# Patient Record
Sex: Female | Born: 1980 | Race: White | Hispanic: No | Marital: Single | Smoking: Former smoker
Health system: Southern US, Community
[De-identification: ages and names within clinical notes are randomized; demographics above are authoritative.]

## PROBLEM LIST (undated history)

## (undated) DIAGNOSIS — F419 Anxiety disorder, unspecified: Secondary | ICD-10-CM

## (undated) DIAGNOSIS — D649 Anemia, unspecified: Secondary | ICD-10-CM

## (undated) DIAGNOSIS — F329 Major depressive disorder, single episode, unspecified: Secondary | ICD-10-CM

## (undated) DIAGNOSIS — F32A Depression, unspecified: Secondary | ICD-10-CM

## (undated) HISTORY — DX: Anxiety disorder, unspecified: F41.9

## (undated) HISTORY — DX: Anemia, unspecified: D64.9

## (undated) HISTORY — DX: Major depressive disorder, single episode, unspecified: F32.9

## (undated) HISTORY — DX: Depression, unspecified: F32.A

---

## 2006-02-27 ENCOUNTER — Ambulatory Visit: Payer: Self-pay | Admitting: Family Medicine

## 2007-10-28 ENCOUNTER — Emergency Department: Payer: Self-pay | Admitting: Emergency Medicine

## 2007-12-27 ENCOUNTER — Emergency Department: Payer: Self-pay | Admitting: Internal Medicine

## 2011-03-21 ENCOUNTER — Ambulatory Visit: Payer: Self-pay | Admitting: Advanced Practice Midwife

## 2011-08-08 ENCOUNTER — Inpatient Hospital Stay: Payer: Self-pay

## 2011-10-24 IMAGING — US US OB US >=[ID] SNGL FETUS
1 series · 14 of 28 positions shown · non-contrast
Comparison: none

REASON FOR EXAM: for dating
COMMENTS:

[Series 1: us ob us >=(id) sngl fetus · 0.35mm/px · 14 of 47 slices shown]
[im 2/47]
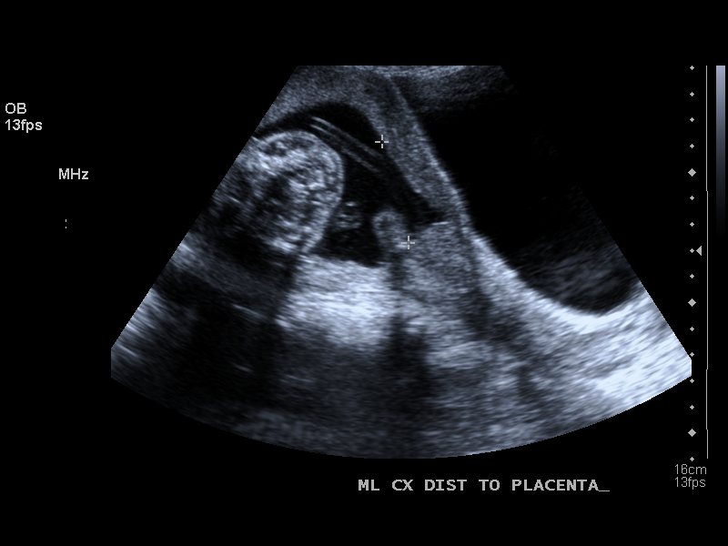
[im 6/47]
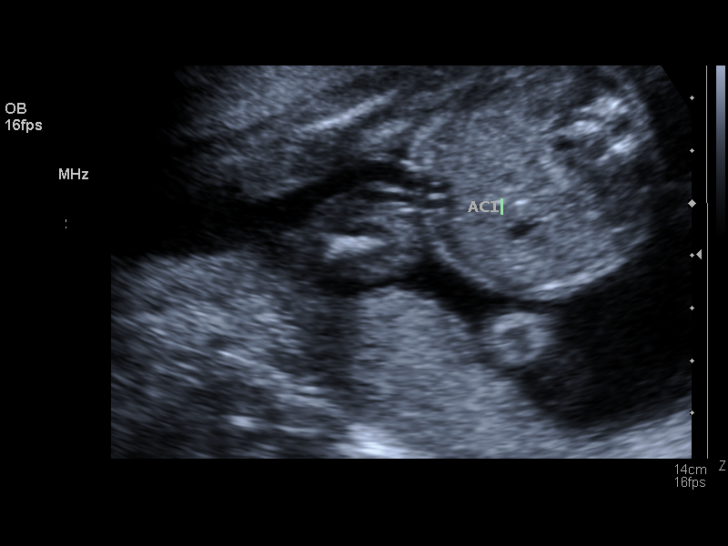
[im 9/47]
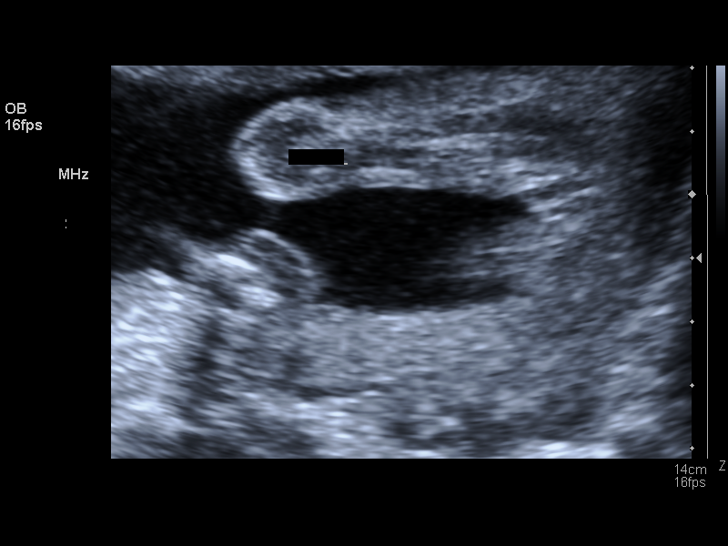
[im 12/47]
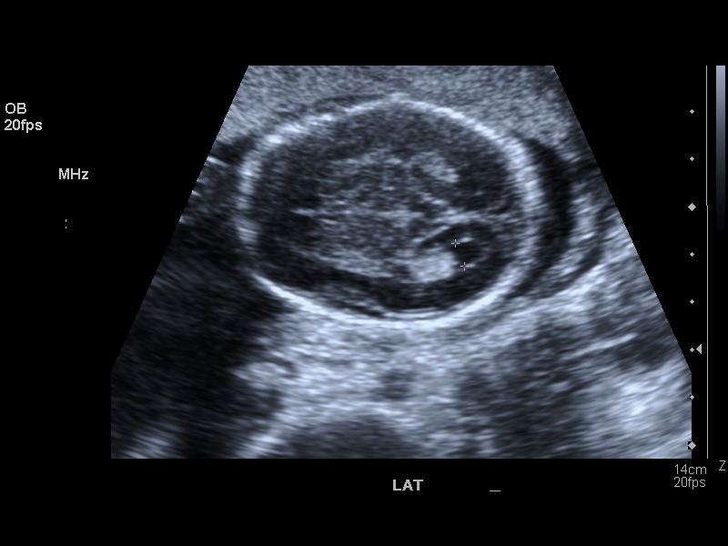
[im 16/47]
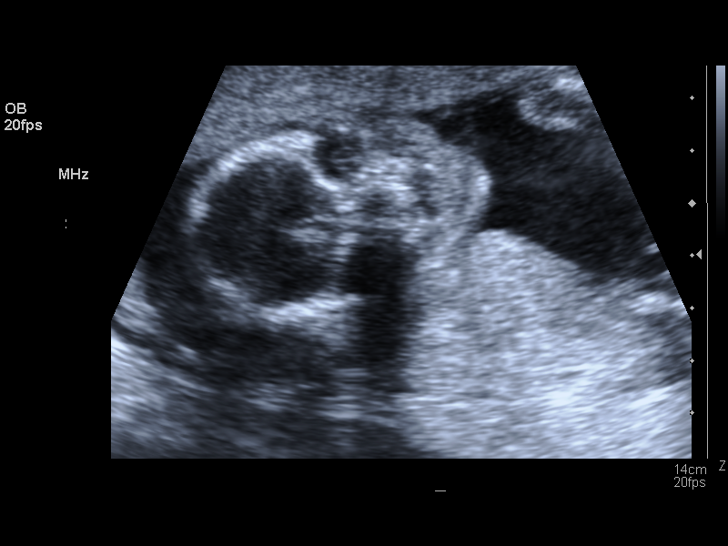
[im 19/47]
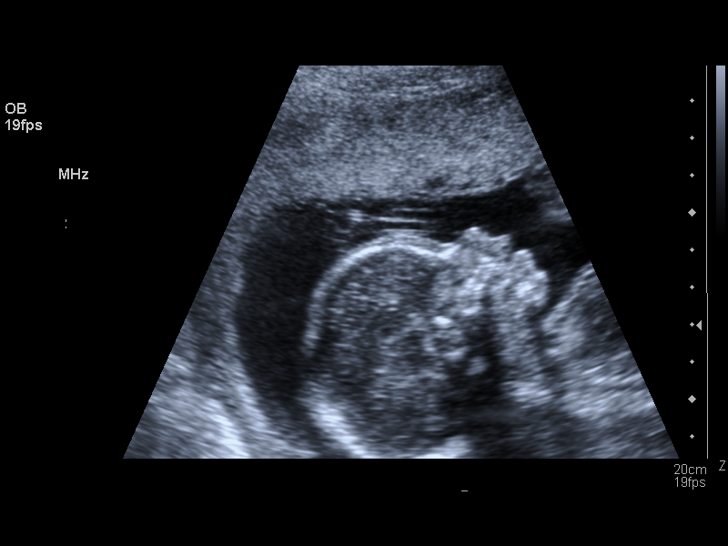
[im 23/47]
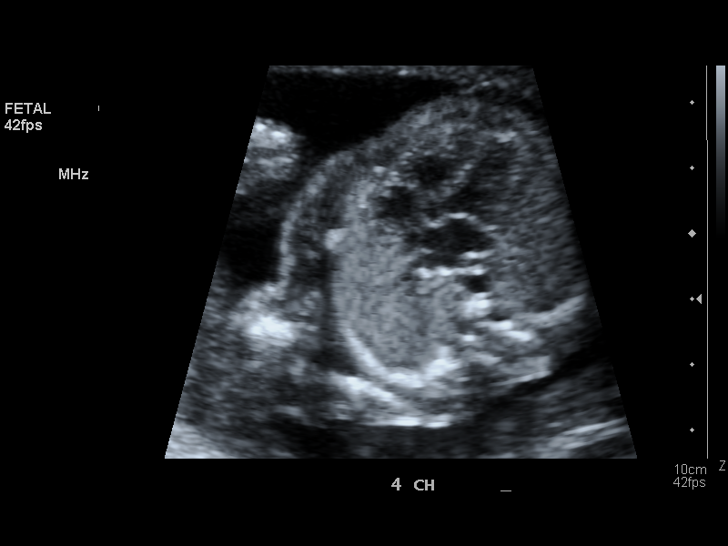
[im 26/47]
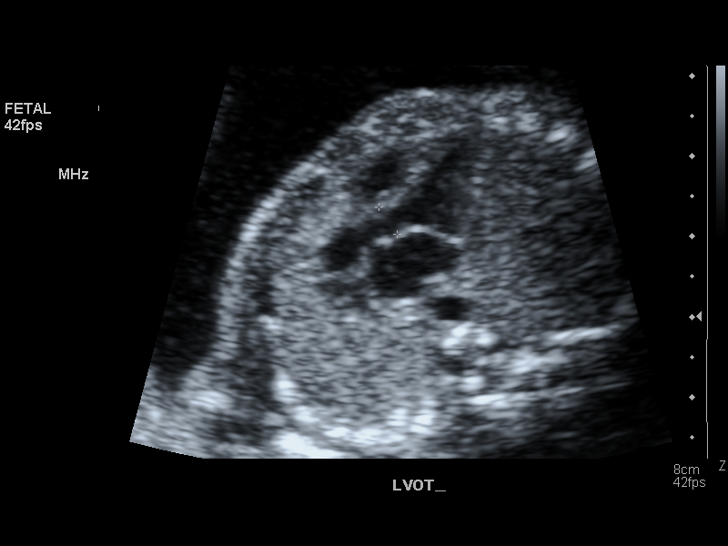
[im 29/47]
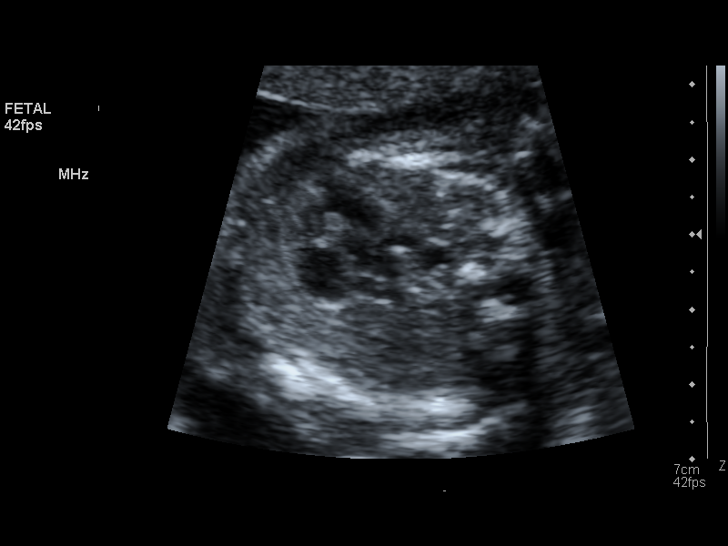
[im 33/47]
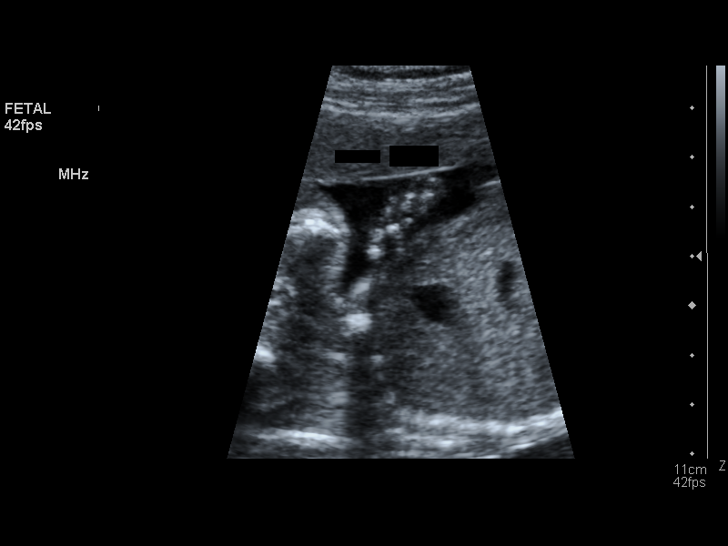
[im 36/47]
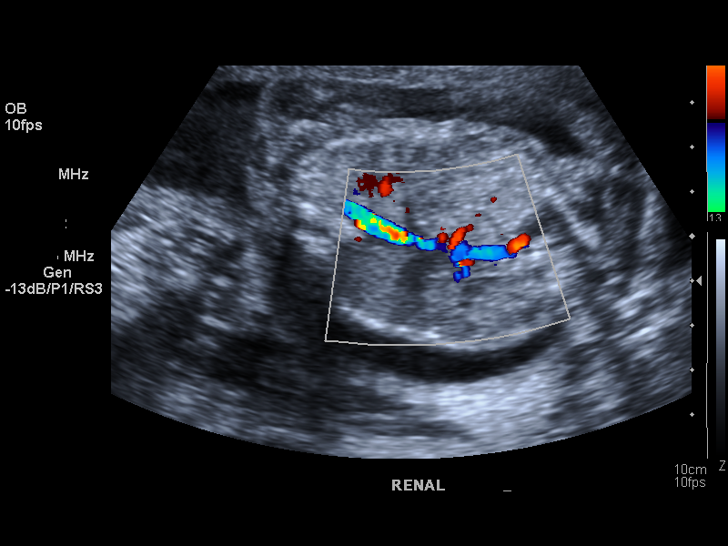
[im 40/47]
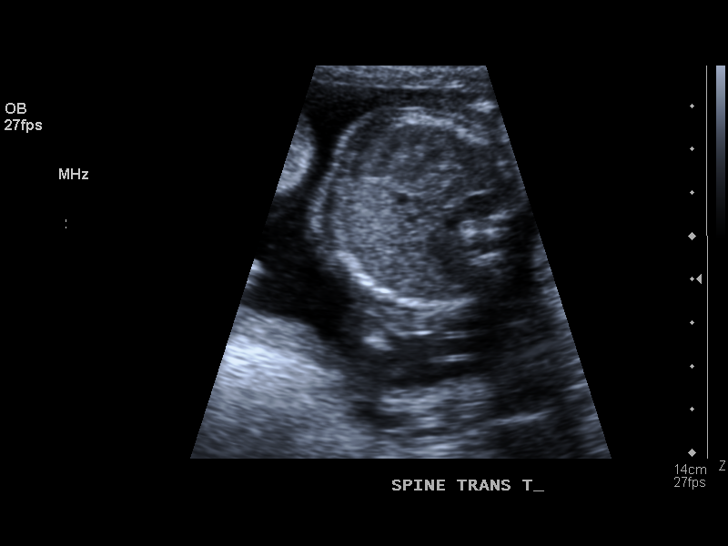
[im 43/47]
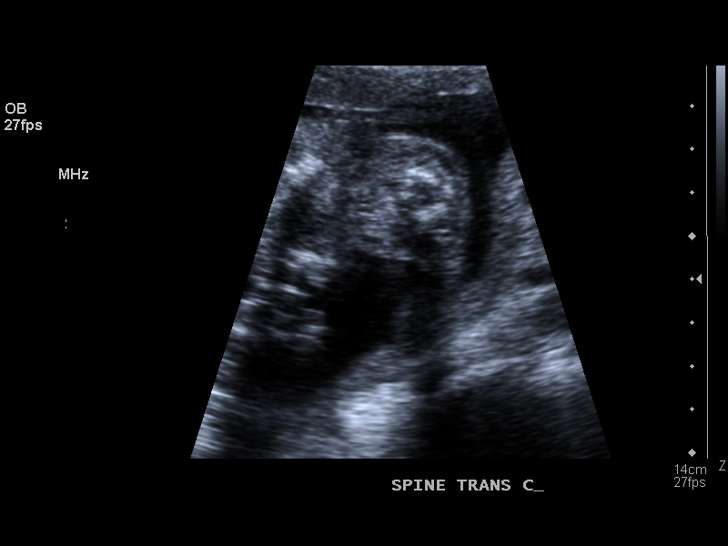
[im 47/47]
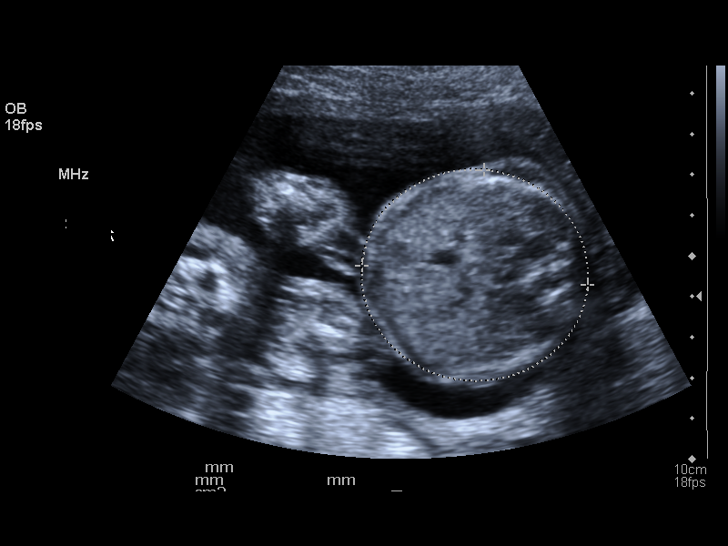

[14 of 28 positions shown; findings below may reference images not displayed]

PROCEDURE:     US  - US OB GREATER/OR EQUAL TO [id number]  - March 21, 2011  [DATE]

RESULT:     There is observed a single living intrauterine gestation.
Presentation currently is breech. Fetal cardiac activity was monitored at
153 beats per minute. Amniotic fluid volume  appears normal. The placenta is
anterior in location. Inferior to the placenta is approximately 4.0 cm above
the cervix. The fetal heart, stomach, and urinary bladder are visualized. No
hydrocephalus or hydronephrosis is seen. No fetal abnormalities are
detected. Fetal measurements are as follows:

BPD     45.9 mm      Corresponding to 19 weeks 6 days.
HC           179.4 mm      Corresponding to 20 weeks 3 days.
AC          160.1 mm      Corresponding to 21 weeks 6 weeks.
FL           35.1 mm      Corresponding to 21 weeks 1 day.
HL           32.6 mm      Corresponding to 21 week 0 days.

EFW is 417 grams + / - 62 grams. Average ultrasound age is 20 weeks 6 days.
Ultrasound EDD is 08/02/2011.
IMPRESSION: Please see above.

## 2012-03-31 ENCOUNTER — Ambulatory Visit: Payer: Self-pay | Admitting: Advanced Practice Midwife

## 2012-07-01 ENCOUNTER — Inpatient Hospital Stay: Payer: Self-pay | Admitting: Obstetrics and Gynecology

## 2012-07-01 LAB — CBC WITH DIFFERENTIAL/PLATELET
Basophil %: 0.3 %
Eosinophil #: 0.1 10*3/uL (ref 0.0–0.7)
HGB: 11.6 g/dL — ABNORMAL LOW (ref 12.0–16.0)
MCH: 34.2 pg — ABNORMAL HIGH (ref 26.0–34.0)
MCHC: 34.6 g/dL (ref 32.0–36.0)
Neutrophil %: 71.6 %
Platelet: 191 10*3/uL (ref 150–440)

## 2012-07-02 LAB — HEMATOCRIT: HCT: 35.4 % (ref 35.0–47.0)

## 2012-11-03 IMAGING — US US OB US >=[ID] SNGL FETUS
1 series · 17 of 28 positions shown · non-contrast
Comparison: none

REASON FOR EXAM: dating
COMMENTS:

[Series 1: us ob us >=(id) sngl fetus · 17 of 56 slices shown]
[im 1/56]
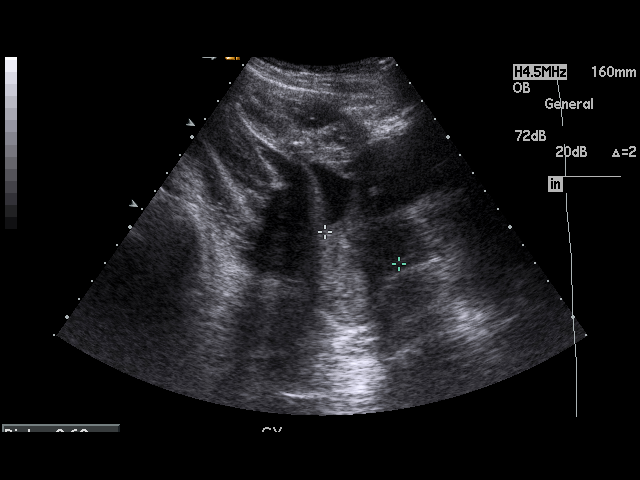
[im 5/56]
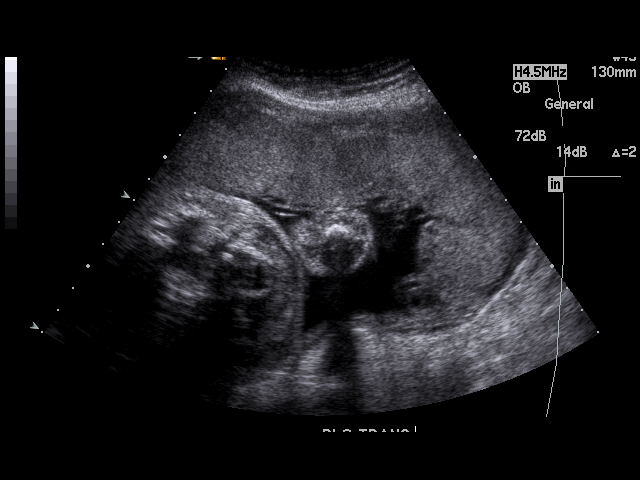
[im 9/56]
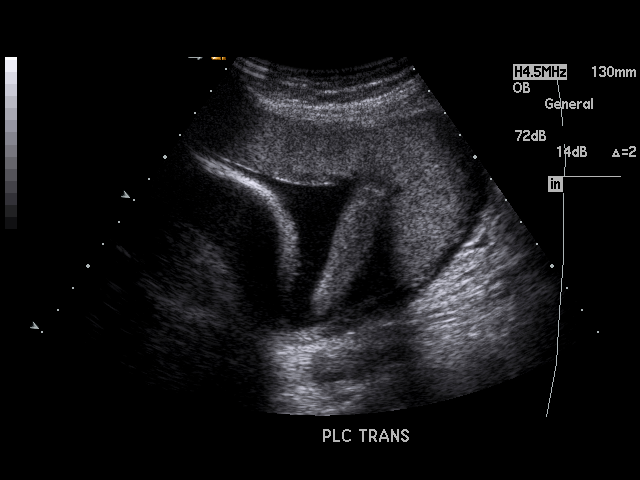
[im 11/56]
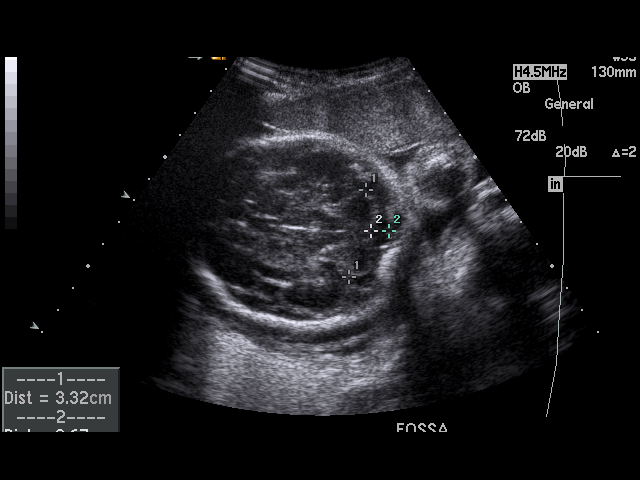
[im 15/56]
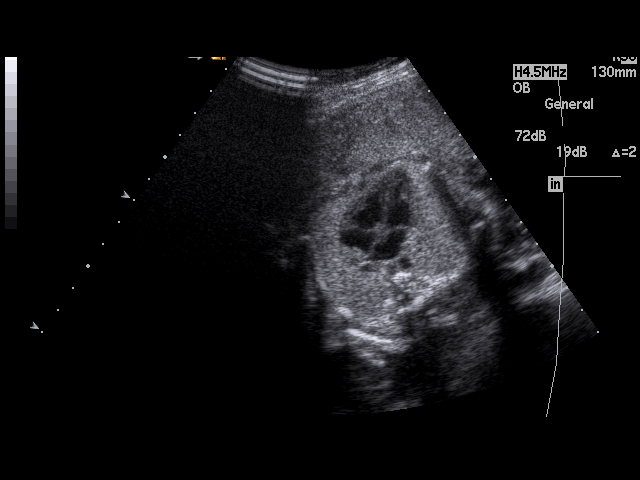
[im 19/56]
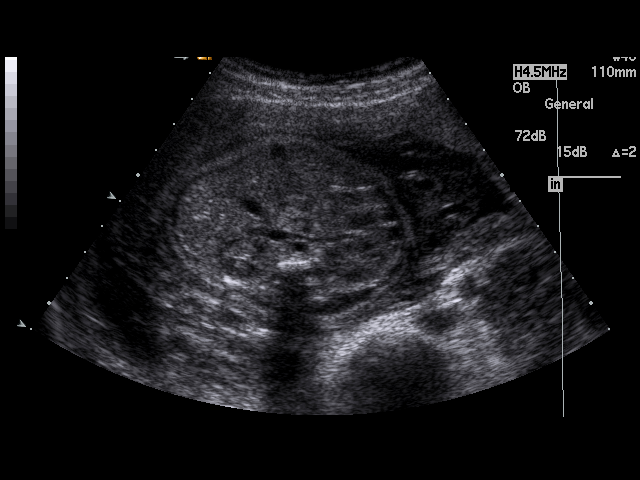
[im 21/56]
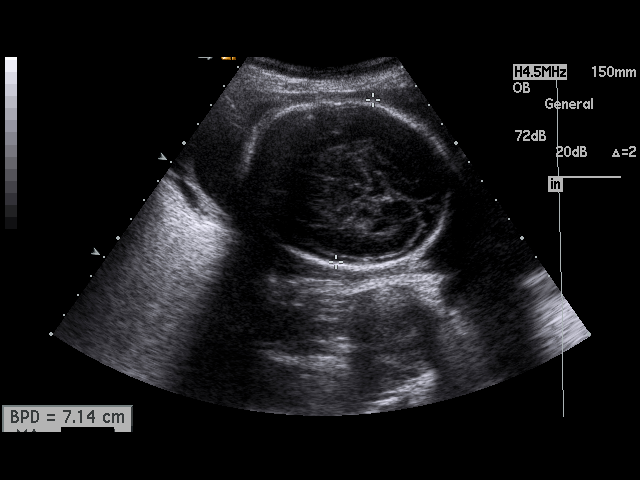
[im 25/56]
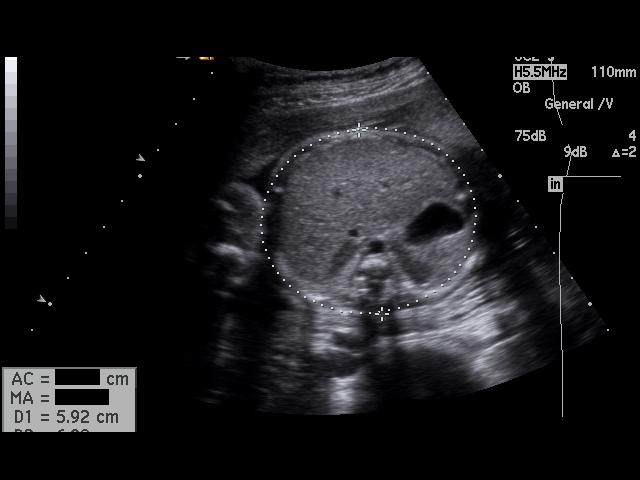
[im 29/56]
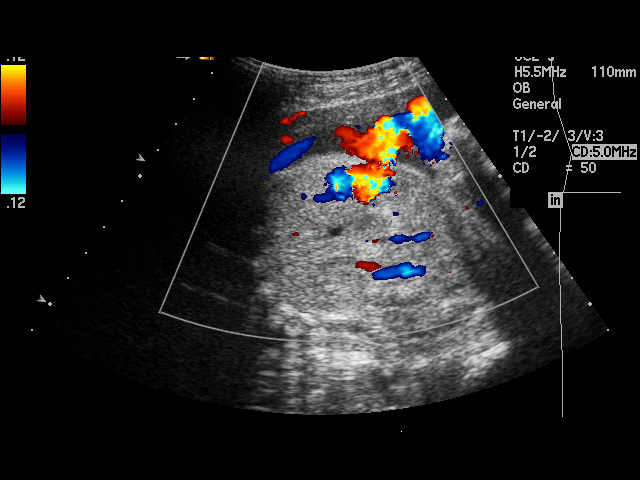
[im 31/56]
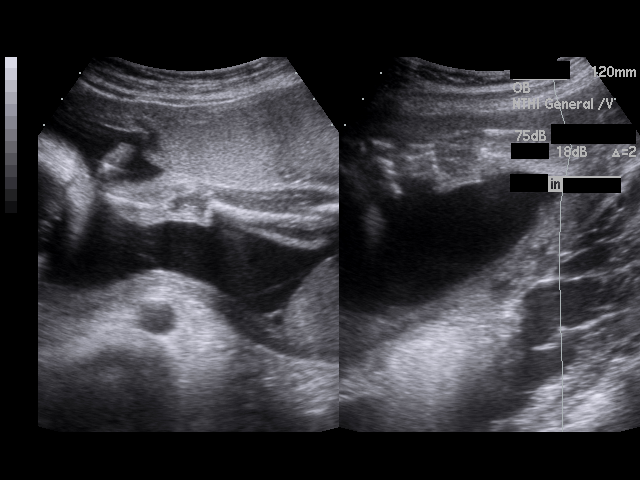
[im 35/56]
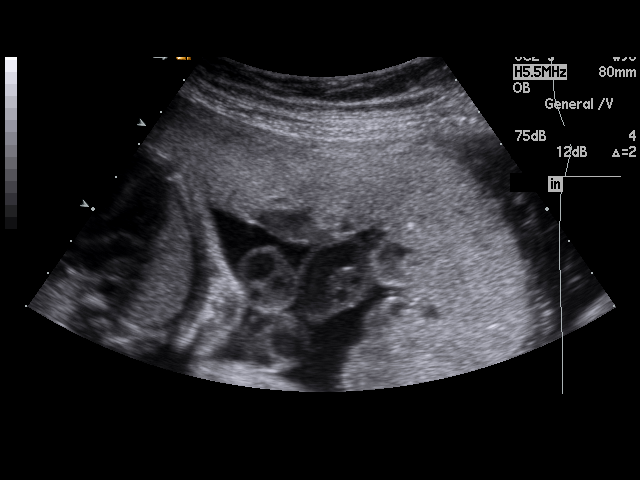
[im 37/56]
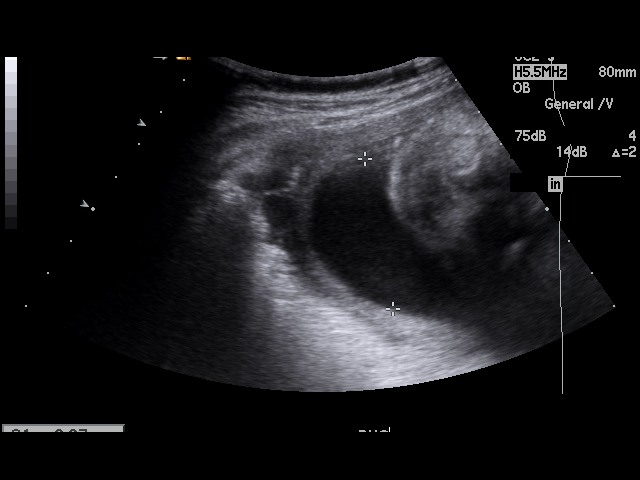
[im 41/56]
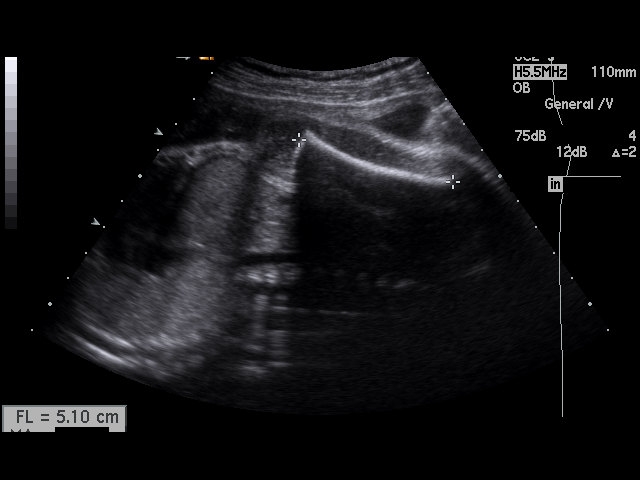
[im 45/56]
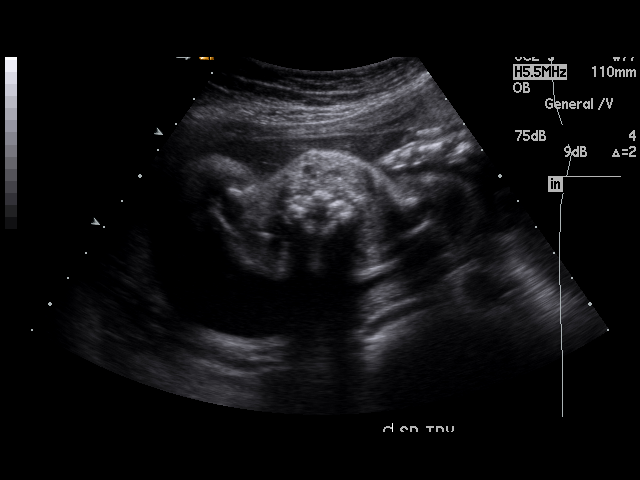
[im 47/56]
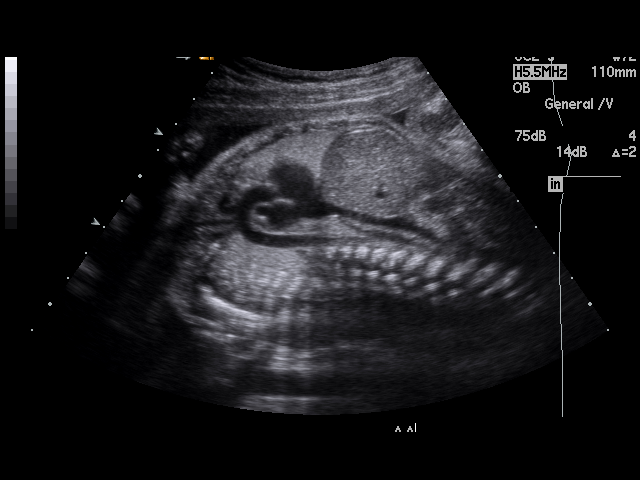
[im 51/56]
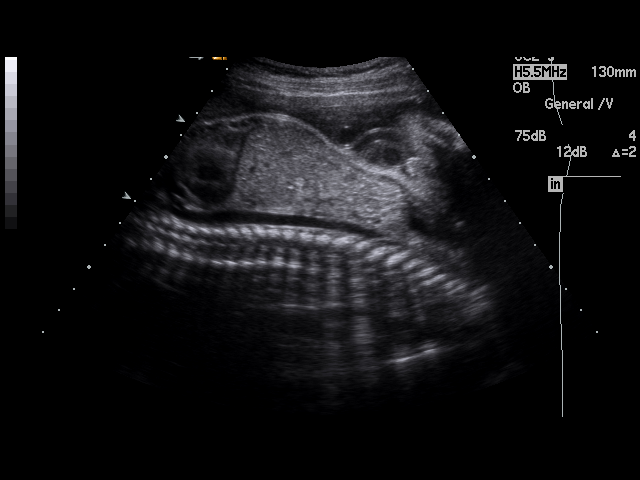
[im 56/56]
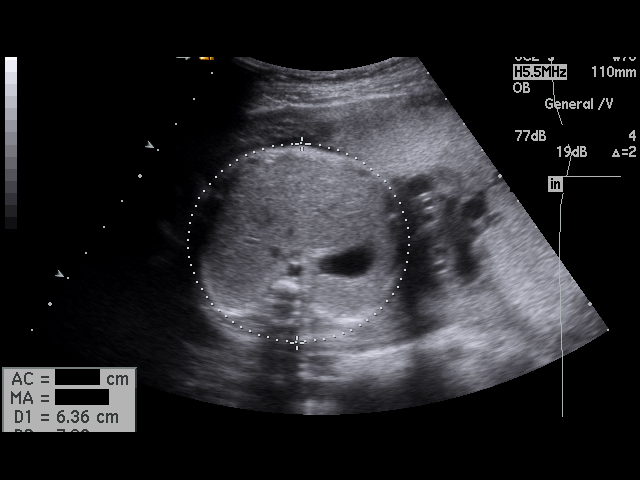

[17 of 28 positions shown; findings below may reference images not displayed]

PROCEDURE:     US  - US OB GREATER/OR EQUAL TO EQQCC  - March 31, 2012 [DATE]

RESULT:     Transabdominal OB protocol pelvic sonogram demonstrates a single
intrauterine fetus with cardiac, trunk, extremity and diaphragmatic motion
visualized during the study. The placenta is anterior in position extending
to the left lateral aspect of the uterus as well. The length of the cervix
is 3.68 cm. The cervix is closed. The distance from the inferior margin of
the placenta to the internal cervical os is 9.98 cm. The amniotic fluid
volume is visually normal with an AFI of 14.9 cm. Fetal anatomy is normal.
Based on the fetal measurements obtained, gestational age is estimated at 27
weeks 1 day + / - a standard deviation of 14 to 15 days with an ultrasound EDD
June 29, 2012. The current estimated fetal weight is 933 grams + / - 125
grams.
IMPRESSION: 1.     Viable single intrauterine fetus of 27 weeks 1 day as discussed
above.
2.     Fetal heart rate measures 131 beats per minute.

## 2013-05-10 ENCOUNTER — Ambulatory Visit: Payer: Self-pay | Admitting: Internal Medicine

## 2014-10-17 ENCOUNTER — Ambulatory Visit: Payer: Self-pay | Admitting: General Surgery

## 2014-10-19 ENCOUNTER — Ambulatory Visit: Payer: Self-pay | Admitting: Nurse Practitioner

## 2014-11-02 ENCOUNTER — Encounter: Payer: Self-pay | Admitting: *Deleted

## 2015-05-09 NOTE — H&P (Signed)
L&D Evaluation:  History:   HPI 34yo [id number] at term presents with regular uterine contrtactions.  notes good fetal movement, no leakage of fluid, no vaginal bleeding.  pregnancy uncomplicated.    Patient's Medical History No Chronic Illness    Patient's Surgical History none    Medications Pre Natal Vitamins  Iron  zyrtec, "something for heartburn"    Allergies NKDA    Social History none  gave one of babies up for adoption. Current father of baby does not know    Family History Non-Contributory   ROS:   ROS All systems were reviewed.  HEENT, CNS, GI, GU, Respiratory, CV, Renal and Musculoskeletal systems were found to be normal., unless noted in HPI   Exam:   Vital Signs stable    General mild distress with contractions    Mental Status clear    Chest clear    Heart normal sinus rhythm    Abdomen gravid, tender with contractions    Estimated Fetal Weight Average for gestational age    Fetal Position cephalic    Back no CVAT    Edema no edema    Pelvic 6cm per RN    Mebranes Intact    FHT normal rate with no decels    FHT Description 135/mod var/+accels/no decels    Ucx irregular, 3-4 q 10 min   Impression:   Impression active labor   Plan:   Plan EFM/NST, monitor contractions and for cervical change    Comments admit for labor GBS neg expectant management desires epidural, will get admission labs   Electronic Signatures: Conard NovakJackson, Ellese Julius D (MD)  (Signed 03-Jul-13 09:42)  Authored: L&D Evaluation   Last Updated: 03-Jul-13 09:42 by Conard NovakJackson, Wyley Hack D (MD)

## 2018-10-01 ENCOUNTER — Encounter: Payer: Self-pay | Admitting: Oncology

## 2018-10-01 ENCOUNTER — Inpatient Hospital Stay: Payer: Medicaid Other

## 2018-10-01 ENCOUNTER — Other Ambulatory Visit: Payer: Self-pay

## 2018-10-01 ENCOUNTER — Encounter (INDEPENDENT_AMBULATORY_CARE_PROVIDER_SITE_OTHER): Payer: Self-pay

## 2018-10-01 ENCOUNTER — Inpatient Hospital Stay: Payer: Medicaid Other | Attending: Oncology | Admitting: Oncology

## 2018-10-01 VITALS — BP 126/86 | HR 76 | Temp 97.3°F | Resp 18 | Ht 63.0 in | Wt 174.2 lb

## 2018-10-01 DIAGNOSIS — D649 Anemia, unspecified: Secondary | ICD-10-CM | POA: Diagnosis present

## 2018-10-01 DIAGNOSIS — R5383 Other fatigue: Secondary | ICD-10-CM

## 2018-10-01 DIAGNOSIS — F419 Anxiety disorder, unspecified: Secondary | ICD-10-CM | POA: Diagnosis not present

## 2018-10-01 DIAGNOSIS — D509 Iron deficiency anemia, unspecified: Secondary | ICD-10-CM | POA: Diagnosis not present

## 2018-10-01 DIAGNOSIS — F418 Other specified anxiety disorders: Secondary | ICD-10-CM

## 2018-10-01 DIAGNOSIS — Z87891 Personal history of nicotine dependence: Secondary | ICD-10-CM

## 2018-10-01 DIAGNOSIS — Z803 Family history of malignant neoplasm of breast: Secondary | ICD-10-CM

## 2018-10-01 LAB — COMPREHENSIVE METABOLIC PANEL
ALT: 19 U/L (ref 0–44)
ANION GAP: 9 (ref 5–15)
AST: 28 U/L (ref 15–41)
Albumin: 4.4 g/dL (ref 3.5–5.0)
Alkaline Phosphatase: 48 U/L (ref 38–126)
BILIRUBIN TOTAL: 0.6 mg/dL (ref 0.3–1.2)
BUN: 14 mg/dL (ref 6–20)
CO2: 25 mmol/L (ref 22–32)
Calcium: 9.5 mg/dL (ref 8.9–10.3)
Chloride: 106 mmol/L (ref 98–111)
Creatinine, Ser: 0.9 mg/dL (ref 0.44–1.00)
Glucose, Bld: 99 mg/dL (ref 70–99)
Potassium: 4.3 mmol/L (ref 3.5–5.1)
Sodium: 140 mmol/L (ref 135–145)
TOTAL PROTEIN: 7.9 g/dL (ref 6.5–8.1)

## 2018-10-01 LAB — CBC WITH DIFFERENTIAL/PLATELET
Basophils Absolute: 0 10*3/uL (ref 0–0.1)
Basophils Relative: 0 %
EOS PCT: 4 %
Eosinophils Absolute: 0.3 10*3/uL (ref 0–0.7)
HEMATOCRIT: 36.1 % (ref 35.0–47.0)
Hemoglobin: 12.5 g/dL (ref 12.0–16.0)
LYMPHS PCT: 26 %
Lymphs Abs: 2 10*3/uL (ref 1.0–3.6)
MCH: 32 pg (ref 26.0–34.0)
MCHC: 34.6 g/dL (ref 32.0–36.0)
MCV: 92.5 fL (ref 80.0–100.0)
MONO ABS: 0.3 10*3/uL (ref 0.2–0.9)
MONOS PCT: 3 %
NEUTROS ABS: 5.1 10*3/uL (ref 1.4–6.5)
Neutrophils Relative %: 67 %
PLATELETS: 260 10*3/uL (ref 150–440)
RBC: 3.91 MIL/uL (ref 3.80–5.20)
RDW: 12.7 % (ref 11.5–14.5)
WBC: 7.7 10*3/uL (ref 3.6–11.0)

## 2018-10-01 LAB — URINALYSIS, COMPLETE (UACMP) WITH MICROSCOPIC
BACTERIA UA: NONE SEEN
BILIRUBIN URINE: NEGATIVE
Glucose, UA: NEGATIVE mg/dL
KETONES UR: NEGATIVE mg/dL
LEUKOCYTES UA: NEGATIVE
Nitrite: NEGATIVE
Protein, ur: NEGATIVE mg/dL
SPECIFIC GRAVITY, URINE: 1.019 (ref 1.005–1.030)
pH: 5 (ref 5.0–8.0)

## 2018-10-01 LAB — RETICULOCYTES
RBC.: 3.73 MIL/uL — ABNORMAL LOW (ref 3.80–5.20)
RETIC CT PCT: 1.5 % (ref 0.4–3.1)
Retic Count, Absolute: 56 10*3/uL (ref 19.0–183.0)

## 2018-10-01 LAB — IRON AND TIBC
IRON: 64 ug/dL (ref 28–170)
SATURATION RATIOS: 18 % (ref 10.4–31.8)
TIBC: 362 ug/dL (ref 250–450)
UIBC: 298 ug/dL

## 2018-10-01 LAB — FERRITIN: FERRITIN: 94 ng/mL (ref 11–307)

## 2018-10-01 LAB — VITAMIN B12: Vitamin B-12: 328 pg/mL (ref 180–914)

## 2018-10-01 LAB — LACTATE DEHYDROGENASE: LDH: 133 U/L (ref 98–192)

## 2018-10-01 LAB — FOLATE: FOLATE: 24 ng/mL (ref >5.9)

## 2018-10-02 ENCOUNTER — Encounter: Payer: Self-pay | Admitting: Oncology

## 2018-10-02 LAB — CELIAC DISEASE PANEL
ENDOMYSIAL ANTIBODY IGA: NEGATIVE
IgA: 228 mg/dL (ref 87–352)

## 2018-10-02 LAB — MULTIPLE MYELOMA PANEL, SERUM
ALPHA2 GLOB SERPL ELPH-MCNC: 0.8 g/dL (ref 0.4–1.0)
Albumin SerPl Elph-Mcnc: 3.9 g/dL (ref 2.9–4.4)
Albumin/Glob SerPl: 1.3 (ref 0.7–1.7)
Alpha 1: 0.2 g/dL (ref 0.0–0.4)
B-Globulin SerPl Elph-Mcnc: 1.1 g/dL (ref 0.7–1.3)
Gamma Glob SerPl Elph-Mcnc: 1 g/dL (ref 0.4–1.8)
Globulin, Total: 3.2 g/dL (ref 2.2–3.9)
IGG (IMMUNOGLOBIN G), SERUM: 978 mg/dL (ref 700–1600)
IGM (IMMUNOGLOBULIN M), SRM: 144 mg/dL (ref 26–217)
IgA: 231 mg/dL (ref 87–352)
TOTAL PROTEIN ELP: 7.1 g/dL (ref 6.0–8.5)

## 2018-10-02 LAB — HAPTOGLOBIN: Haptoglobin: 146 mg/dL (ref 34–200)

## 2018-10-02 NOTE — Progress Notes (Signed)
Hematology/Oncology Consult note [facility] [phone] [phone]  Patient Care Team: Fayrene Helper, NP as PCP - General (Nurse Practitioner)   Name of the patient: Toni Wilkins  [id number]  [birth year]    Reason for referral- anemia   Referring physician- Dr. Karma Greaser  Date of visit: 10/02/18   History of presenting illness-patient is a 37 year old female with a past medical history significant for anxiety and depression.  She is also on Suboxone for opioid addiction.  She has been referred to [place] for normocytic anemia.Recent CBC from 09/01/2018 showed white count of 6.8, H&H of 9.3/28.8 with an MCV of 96 and a platelet count of 269.  TSH was within normal limits CMP was within normal limits.  I do not have any prior counts for comparison.  I also do not have any iron studies for my review.  Patient however states that she has had iron studies checked in the recent past and was told that she is iron deficient and started taking oral iron tablets for the last 2 to 3 weeks.  She admits that she has not been taking them every day as she has some abdominal pain associated with it.  Denies any family history of colon cancer.  Denies any blood in her stool or urine.  Denies any consistent use of NSAIDs.  Patient mainly reports fatigue but denies other complaints.  Her appetite is good and she denies any unintentional weight loss  ECOG PS- 0  Pain scale- 0   Review of systems- Review of Systems  Constitutional: Negative for chills, fever, malaise/fatigue and weight loss.  HENT: Negative for congestion, ear discharge and nosebleeds.   Eyes: Negative for blurred vision.  Respiratory: Negative for cough, hemoptysis, sputum production, shortness of breath and wheezing.   Cardiovascular: Negative for chest pain, palpitations, orthopnea and claudication.  Gastrointestinal: Negative for abdominal pain, blood in stool, constipation, diarrhea, heartburn,  melena, nausea and vomiting.  Genitourinary: Negative for dysuria, flank pain, frequency, hematuria and urgency.  Musculoskeletal: Negative for back pain, joint pain and myalgias.  Skin: Negative for rash.  Neurological: Negative for dizziness, tingling, focal weakness, seizures, weakness and headaches.  Endo/Heme/Allergies: Does not bruise/bleed easily.  Psychiatric/Behavioral: Negative for depression and suicidal ideas. The patient does not have insomnia.     Not on File  There are no active problems to display for this patient.    Past Medical History:  Diagnosis Date  . Anemia   . Anxiety   . Depression      History reviewed. No pertinent surgical history.  Social History   Socioeconomic History  . Marital status: Single    Spouse name: Not on file  . Number of children: Not on file  . Years of education: Not on file  . Highest education level: Not on file  Occupational History  . Not on file  Social Needs  . Financial resource strain: Not on file  . Food insecurity:    Worry: Not on file    Inability: Not on file  . Transportation needs:    Medical: Not on file    Non-medical: Not on file  Tobacco Use  . Smoking status: Former Smoker    Types: Cigarettes    Last attempt to quit: 10/01/1998    Years since quitting: 20.0  . Smokeless tobacco: Never Used  Substance and Sexual Activity  . Alcohol use: Not on file  . Drug use: Not on file  . Sexual activity: Not  on file  Lifestyle  . Physical activity:    Days per week: Not on file    Minutes per session: Not on file  . Stress: Not on file  Relationships  . Social connections:    Talks on phone: Not on file    Gets together: Not on file    Attends religious service: Not on file    Active member of club or organization: Not on file    Attends meetings of clubs or organizations: Not on file    Relationship status: Not on file  . Intimate partner violence:    Fear of current or ex partner: Not on file     Emotionally abused: Not on file    Physically abused: Not on file    Forced sexual activity: Not on file  Other Topics Concern  . Not on file  Social History Narrative  . Not on file     Family History  Problem Relation Age of Onset  . Breast cancer Mother      Current Outpatient Medications:  .  buPROPion (WELLBUTRIN XL) 300 MG 24 hr tablet, , Disp: , Rfl: 2 .  clonazePAM (KLONOPIN) 0.5 MG tablet, , Disp: , Rfl: 0 .  clonazePAM (KLONOPIN) 1 MG tablet, , Disp: , Rfl: 0 .  ferrous sulfate 325 (65 FE) MG tablet, Take 325 mg by mouth 2 (two) times daily with a meal., Disp: , Rfl:  .  sertraline (ZOLOFT) 100 MG tablet, , Disp: , Rfl: 2 .  SPRINTEC 28 0.25-35 MG-MCG tablet, , Disp: , Rfl: 11 .  SUBOXONE 8-2 MG FILM, , Disp: , Rfl: 0   Physical exam:  Vitals:   10/01/18 1009  BP: 126/86  Pulse: 76  Resp: 18  Temp: (!) 97.3 F (36.3 C)  TempSrc: Tympanic  SpO2: 95%  Weight: 174 lb 3.2 oz (79 kg)  Height: 5\' 3"  (1.6 m)   Physical Exam  Constitutional: She is oriented to person, place, and time. She appears well-developed and well-nourished.  HENT:  Head: Normocephalic and atraumatic.  Eyes: Pupils are equal, round, and reactive to light. EOM are normal.  Neck: Normal range of motion.  Cardiovascular: Normal rate, regular rhythm and normal heart sounds.  Pulmonary/Chest: Effort normal and breath sounds normal.  Abdominal: Soft. Bowel sounds are normal.  Lymphadenopathy:  No palpable cervical, supraclavicular, axillary or inguinal adenopathy   Neurological: She is alert and oriented to person, place, and time.  Skin: Skin is warm and dry.       CMP Latest Ref Rng & Units 10/01/2018  Glucose 70 - 99 mg/dL 99  BUN 6 - 20 mg/dL 14  Creatinine 1.61 - 0.96 mg/dL 0.45  Sodium 409 - 811 mmol/L 140  Potassium 3.5 - 5.1 mmol/L 4.3  Chloride 98 - 111 mmol/L 106  CO2 22 - 32 mmol/L 25  Calcium 8.9 - 10.3 mg/dL 9.5  Total Protein 6.5 - 8.1 g/dL 7.9  Total Bilirubin 0.3 -  1.2 mg/dL 0.6  Alkaline Phos 38 - 126 U/L 48  AST 15 - 41 U/L 28  ALT 0 - 44 U/L 19   CBC Latest Ref Rng & Units 10/01/2018  WBC 3.6 - 11.0 K/uL 7.7  Hemoglobin 12.0 - 16.0 g/dL 91.4  Hematocrit 78.2 - 47.0 % 36.1  Platelets 150 - 440 K/uL 260     Assessment and plan- Patient is a 37 y.o. female referred for normocytic anemia presumably iron deficiency per patient history  I do  not have any recent iron studies for review.  Patient however reports that her iron studies have been checked and she was found to be iron deficient and started on oral iron.  Today I will check a CBC with differential, ferritin and iron studies, B12 and folate, CMP, myeloma panel, serum free light chains, reticulocyte count and haptoglobin as well as LDH.  I will see her back in 2 weeks time to discuss the results of her blood work.  I will also work her up for possible etiology of her iron deficiency anemia including urinalysis and celiac disease panel.  Patient states that she is a stool occult cards which were negative but negative stool occult cards do not rule out colon cancer or bleeding and they had been designed for colon cancer screening and should not be used in the work-up of iron deficiency anemia.   Thank you for this kind referral and the opportunity to participate in the care of this patient   Visit Diagnosis 1. Normocytic anemia     Dr. Owens Shark, MD, MPH [facility] at [facility] [phone] 10/02/2018 10:55 AM

## 2018-10-13 ENCOUNTER — Inpatient Hospital Stay (HOSPITAL_BASED_OUTPATIENT_CLINIC_OR_DEPARTMENT_OTHER): Payer: Medicaid Other | Admitting: Oncology

## 2018-10-13 ENCOUNTER — Encounter: Payer: Self-pay | Admitting: Oncology

## 2018-10-13 VITALS — BP 119/76 | HR 97 | Temp 97.9°F | Resp 18 | Ht 63.0 in | Wt 173.9 lb

## 2018-10-13 DIAGNOSIS — D509 Iron deficiency anemia, unspecified: Secondary | ICD-10-CM | POA: Diagnosis not present

## 2018-10-13 DIAGNOSIS — Z87891 Personal history of nicotine dependence: Secondary | ICD-10-CM

## 2018-10-13 DIAGNOSIS — D649 Anemia, unspecified: Secondary | ICD-10-CM | POA: Diagnosis not present

## 2018-10-13 NOTE — Progress Notes (Signed)
No new changes noted today 

## 2018-10-15 ENCOUNTER — Ambulatory Visit: Payer: Medicaid Other | Admitting: Oncology

## 2018-10-16 NOTE — Progress Notes (Signed)
Hematology/Oncology Consult note [facility]  Telephone:([phone] Fax:([phone]  Patient Care Team: Danelle Berry, NP as PCP - General (Nurse Practitioner)   Name of the patient: Toni Wilkins  [id number]  Dec 03, 1981   Date of visit: 10/16/18  Diagnosis-history of iron deficiency anemia  Chief complaint/ Reason for visit-discuss results of blood work  Heme/Onc history: patient is a 37 year old female with a past medical history significant for anxiety and depression.  She is also on Suboxone for opioid addiction.  She has been referred to [place] for normocytic anemia.Recent CBC from 09/01/2018 showed white count of 6.8, H&H of 9.3/28.8 with an MCV of 96 and a platelet count of 269.  TSH was within normal limits CMP was within normal limits.  I do not have any prior counts for comparison.  I also do not have any iron studies for my review.  Patient however states that she has had iron studies checked in the recent past and was told that she is iron deficient and started taking oral iron tablets for the last 2 to 3 weeks.  She admits that she has not been taking them every day as she has some abdominal pain associated with it.  Denies any family history of colon cancer.  Denies any blood in her stool or urine.  Denies any consistent use of NSAIDs.  Patient mainly reports fatigue but denies other complaints.  Her appetite is good and she denies any unintentional weight loss  Blood work from 10/01/2018 was as follows: CBC showed white, H&H of 12.5/36.1 with a platelet count of 260.  Urinalysis was negative for hematuria.  MP was normal.  B12 and folate levels were normal.  Ferritin was normal at 94 and iron studies were normal.  Multiple myeloma panel showed no M protein.  Haptoglobin was normal.  LDH was normal at 133.  Celiac disease panel was negative.   Interval history-patient is tolerating oral iron well and reports no constipation or abdominal pain.  Denies  any blood in her stool or urine.  Denies any dark melanotic stool  ECOG PS- 0 Pain scale- 0 Opioid associated constipation- no  Review of systems- Review of Systems  Constitutional: Negative for chills, fever, malaise/fatigue and weight loss.  HENT: Negative for congestion, ear discharge and nosebleeds.   Eyes: Negative for blurred vision.  Respiratory: Negative for cough, hemoptysis, sputum production, shortness of breath and wheezing.   Cardiovascular: Negative for chest pain, palpitations, orthopnea and claudication.  Gastrointestinal: Negative for abdominal pain, blood in stool, constipation, diarrhea, heartburn, melena, nausea and vomiting.  Genitourinary: Negative for dysuria, flank pain, frequency, hematuria and urgency.  Musculoskeletal: Negative for back pain, joint pain and myalgias.  Skin: Negative for rash.  Neurological: Negative for dizziness, tingling, focal weakness, seizures, weakness and headaches.  Endo/Heme/Allergies: Does not bruise/bleed easily.  Psychiatric/Behavioral: Negative for depression and suicidal ideas. The patient does not have insomnia.       Not on File   Past Medical History:  Diagnosis Date  . Anemia   . Anxiety   . Depression      History reviewed. No pertinent surgical history.  Social History   Socioeconomic History  . Marital status: Single    Spouse name: Not on file  . Number of children: Not on file  . Years of education: Not on file  . Highest education level: Not on file  Occupational History  . Not on file  Social Needs  . Financial  resource strain: Not on file  . Food insecurity:    Worry: Not on file    Inability: Not on file  . Transportation needs:    Medical: Not on file    Non-medical: Not on file  Tobacco Use  . Smoking status: Former Smoker    Types: Cigarettes    Last attempt to quit: 10/01/1998    Years since quitting: 20.0  . Smokeless tobacco: Never Used  Substance and Sexual Activity  . Alcohol use:  Not on file  . Drug use: Not on file  . Sexual activity: Not on file  Lifestyle  . Physical activity:    Days per week: Not on file    Minutes per session: Not on file  . Stress: Not on file  Relationships  . Social connections:    Talks on phone: Not on file    Gets together: Not on file    Attends religious service: Not on file    Active member of club or organization: Not on file    Attends meetings of clubs or organizations: Not on file    Relationship status: Not on file  . Intimate partner violence:    Fear of current or ex partner: Not on file    Emotionally abused: Not on file    Physically abused: Not on file    Forced sexual activity: Not on file  Other Topics Concern  . Not on file  Social History Narrative  . Not on file    Family History  Problem Relation Age of Onset  . Breast cancer Mother      Current Outpatient Medications:  .  buPROPion (WELLBUTRIN XL) 300 MG 24 hr tablet, , Disp: , Rfl: 2 .  clonazePAM (KLONOPIN) 0.5 MG tablet, , Disp: , Rfl: 0 .  clonazePAM (KLONOPIN) 1 MG tablet, , Disp: , Rfl: 0 .  ferrous sulfate 325 (65 FE) MG tablet, Take 325 mg by mouth 2 (two) times daily with a meal., Disp: , Rfl:  .  sertraline (ZOLOFT) 100 MG tablet, , Disp: , Rfl: 2 .  SPRINTEC 28 0.25-35 MG-MCG tablet, , Disp: , Rfl: 11 .  SUBOXONE 8-2 MG FILM, , Disp: , Rfl: 0  Physical exam:  Vitals:   10/13/18 1128  BP: 119/76  Pulse: 97  Resp: 18  Temp: 97.9 F (36.6 C)  TempSrc: Tympanic  SpO2: 97%  Weight: 173 lb 14.4 oz (78.9 kg)  Height: _0  (1.6 m)   Physical Exam  Constitutional: She is oriented to person, place, and time. She appears well-developed and well-nourished.  HENT:  Head: Normocephalic and atraumatic.  Eyes: Pupils are equal, round, and reactive to light. EOM are normal.  Neck: Normal range of motion.  Cardiovascular: Normal rate, regular rhythm and normal heart sounds.  Pulmonary/Chest: Effort normal and breath sounds normal.    Abdominal: Soft. Bowel sounds are normal.  Neurological: She is alert and oriented to person, place, and time.  Skin: Skin is warm and dry.     CMP Latest Ref Rng & Units 10/01/2018  Glucose 70 - 99 mg/dL 99  BUN 6 - 20 mg/dL 14  Creatinine 0.44 - 1.00 mg/dL 0.90  Sodium 135 - 145 mmol/L 140  Potassium 3.5 - 5.1 mmol/L 4.3  Chloride 98 - 111 mmol/L 106  CO2 22 - 32 mmol/L 25  Calcium 8.9 - 10.3 mg/dL 9.5  Total Protein 6.5 - 8.1 g/dL 7.9  Total Bilirubin 0.3 - 1.2 mg/dL 0.6  Alkaline Phos 38 - 126 U/L 48  AST 15 - 41 U/L 28  ALT 0 - 44 U/L 19   CBC Latest Ref Rng & Units 10/01/2018  WBC 3.6 - 11.0 K/uL 7.7  Hemoglobin 12.0 - 16.0 g/dL 12.5  Hematocrit 35.0 - 47.0 % 36.1  Platelets 150 - 440 K/uL 260     Assessment and plan- Patient is a 37 y.o. female referred for anemia probably do to iron deficiency  Patient is no longer anemic after she started taking oral iron which she is tolerating well.  I reviewed the results of her recent anemia work-up which were unremarkable and her iron studies are normal white IV iron.  Discussed that the stability of the etiology of her iron deficiency anemia and she reports that her menstrual cycles are not particularly heavy.  Discussed the need for GI work-up.  Patient would like to defer it at this time but consider if she becomes iron deficient again in the future  Repeat CBC with ferritin and iron studies in 3 in 6 months and I will see her back in 6 months   Visit Diagnosis 1. Normocytic anemia   2. Iron deficiency anemia, unspecified iron deficiency anemia type      Dr. Randa Evens, MD, MPH [facility] at [facility] [phone] 10/16/2018 8:33 AM

## 2019-01-14 ENCOUNTER — Inpatient Hospital Stay: Payer: Medicaid Other | Attending: Oncology

## 2019-04-15 ENCOUNTER — Telehealth: Payer: Self-pay | Admitting: Oncology

## 2019-04-16 ENCOUNTER — Inpatient Hospital Stay: Payer: Medicaid Other | Attending: Oncology | Admitting: Oncology

## 2019-04-16 ENCOUNTER — Other Ambulatory Visit: Payer: Self-pay

## 2019-04-16 ENCOUNTER — Inpatient Hospital Stay: Payer: Medicaid Other

## 2019-04-16 DIAGNOSIS — D509 Iron deficiency anemia, unspecified: Secondary | ICD-10-CM | POA: Insufficient documentation

## 2019-04-16 DIAGNOSIS — D649 Anemia, unspecified: Secondary | ICD-10-CM

## 2019-04-16 LAB — IRON AND TIBC
Iron: 83 ug/dL (ref 28–170)
Saturation Ratios: 22 % (ref 10.4–31.8)
TIBC: 386 ug/dL (ref 250–450)
UIBC: 303 ug/dL

## 2019-04-16 LAB — CBC WITH DIFFERENTIAL/PLATELET
Abs Immature Granulocytes: 0.02 10*3/uL (ref 0.00–0.07)
Basophils Absolute: 0 10*3/uL (ref 0.0–0.1)
Basophils Relative: 1 %
Eosinophils Absolute: 0.2 10*3/uL (ref 0.0–0.5)
Eosinophils Relative: 4 %
HCT: 36.1 % (ref 36.0–46.0)
Hemoglobin: 12 g/dL (ref 12.0–15.0)
Immature Granulocytes: 0 %
Lymphocytes Relative: 39 %
Lymphs Abs: 2.5 10*3/uL (ref 0.7–4.0)
MCH: 31.7 pg (ref 26.0–34.0)
MCHC: 33.2 g/dL (ref 30.0–36.0)
MCV: 95.3 fL (ref 80.0–100.0)
Monocytes Absolute: 0.4 10*3/uL (ref 0.1–1.0)
Monocytes Relative: 6 %
Neutro Abs: 3.3 10*3/uL (ref 1.7–7.7)
Neutrophils Relative %: 50 %
Platelets: 230 10*3/uL (ref 150–400)
RBC: 3.79 MIL/uL — ABNORMAL LOW (ref 3.87–5.11)
RDW: 12.1 % (ref 11.5–15.5)
WBC: 6.4 10*3/uL (ref 4.0–10.5)
nRBC: 0 % (ref 0.0–0.2)

## 2019-04-16 LAB — FERRITIN: Ferritin: 160 ng/mL (ref 11–307)

## 2019-04-23 ENCOUNTER — Inpatient Hospital Stay: Payer: Medicaid Other | Admitting: Oncology

## 2021-10-10 ENCOUNTER — Other Ambulatory Visit: Payer: Self-pay | Admitting: Nurse Practitioner

## 2021-10-10 DIAGNOSIS — Z1231 Encounter for screening mammogram for malignant neoplasm of breast: Secondary | ICD-10-CM

## 2022-10-15 ENCOUNTER — Other Ambulatory Visit: Payer: Self-pay | Admitting: Nurse Practitioner

## 2022-10-15 DIAGNOSIS — Z1231 Encounter for screening mammogram for malignant neoplasm of breast: Secondary | ICD-10-CM

## 2022-11-18 ENCOUNTER — Encounter: Payer: Self-pay | Admitting: Radiology

## 2022-11-18 ENCOUNTER — Ambulatory Visit
Admission: RE | Admit: 2022-11-18 | Discharge: 2022-11-18 | Disposition: A | Discharge status: Home / Self Care | Payer: Medicaid Other | Source: Ambulatory Visit | Attending: Nurse Practitioner | Admitting: Nurse Practitioner

## 2022-11-18 DIAGNOSIS — Z1231 Encounter for screening mammogram for malignant neoplasm of breast: Secondary | ICD-10-CM | POA: Insufficient documentation

## 2022-11-25 ENCOUNTER — Encounter: Payer: Self-pay | Admitting: Nurse Practitioner

## 2022-11-26 ENCOUNTER — Other Ambulatory Visit: Payer: Self-pay | Admitting: Nurse Practitioner

## 2022-11-26 DIAGNOSIS — R928 Other abnormal and inconclusive findings on diagnostic imaging of breast: Secondary | ICD-10-CM

## 2022-11-26 DIAGNOSIS — N63 Unspecified lump in unspecified breast: Secondary | ICD-10-CM

## 2022-11-26 DIAGNOSIS — N6489 Other specified disorders of breast: Secondary | ICD-10-CM

## 2022-12-04 ENCOUNTER — Ambulatory Visit
Admission: RE | Admit: 2022-12-04 | Discharge: 2022-12-04 | Disposition: A | Discharge status: Home / Self Care | Payer: Medicaid Other | Source: Ambulatory Visit | Attending: Nurse Practitioner | Admitting: Nurse Practitioner

## 2022-12-04 DIAGNOSIS — N6489 Other specified disorders of breast: Secondary | ICD-10-CM

## 2022-12-04 DIAGNOSIS — N63 Unspecified lump in unspecified breast: Secondary | ICD-10-CM

## 2022-12-04 DIAGNOSIS — R928 Other abnormal and inconclusive findings on diagnostic imaging of breast: Secondary | ICD-10-CM | POA: Diagnosis present

## 2023-04-07 ENCOUNTER — Other Ambulatory Visit: Payer: Self-pay | Admitting: Nurse Practitioner

## 2023-04-07 DIAGNOSIS — N6489 Other specified disorders of breast: Secondary | ICD-10-CM

## 2023-06-09 ENCOUNTER — Inpatient Hospital Stay: Admission: RE | Admit: 2023-06-09 | Payer: Medicaid Other | Source: Ambulatory Visit

## 2023-06-09 ENCOUNTER — Other Ambulatory Visit: Payer: Medicaid Other

## 2023-06-11 ENCOUNTER — Ambulatory Visit
Admission: RE | Admit: 2023-06-11 | Discharge: 2023-06-11 | Disposition: A | Discharge status: Home / Self Care | Payer: 59 | Source: Ambulatory Visit | Attending: Nurse Practitioner | Admitting: Nurse Practitioner

## 2023-06-11 DIAGNOSIS — N6489 Other specified disorders of breast: Secondary | ICD-10-CM | POA: Diagnosis not present

## 2023-06-11 DIAGNOSIS — N6312 Unspecified lump in the right breast, upper inner quadrant: Secondary | ICD-10-CM | POA: Diagnosis not present

## 2023-06-12 ENCOUNTER — Other Ambulatory Visit: Payer: Self-pay | Admitting: Nurse Practitioner

## 2023-06-12 DIAGNOSIS — R928 Other abnormal and inconclusive findings on diagnostic imaging of breast: Secondary | ICD-10-CM

## 2023-06-12 DIAGNOSIS — Z1231 Encounter for screening mammogram for malignant neoplasm of breast: Secondary | ICD-10-CM

## 2023-06-12 DIAGNOSIS — N6489 Other specified disorders of breast: Secondary | ICD-10-CM

## 2023-06-26 ENCOUNTER — Ambulatory Visit (INDEPENDENT_AMBULATORY_CARE_PROVIDER_SITE_OTHER): Payer: 59 | Admitting: Family

## 2023-06-26 DIAGNOSIS — R8290 Unspecified abnormal findings in urine: Secondary | ICD-10-CM

## 2023-06-26 LAB — POCT URINALYSIS DIPSTICK
Bilirubin, UA: NEGATIVE
Blood, UA: NEGATIVE
Glucose, UA: NEGATIVE
Ketones, UA: NEGATIVE
Nitrite, UA: POSITIVE
Protein, UA: NEGATIVE
Spec Grav, UA: 1.015 (ref 1.010–1.025)
Urobilinogen, UA: 0.2 E.U./dL
pH, UA: 7 (ref 5.0–8.0)

## 2023-06-27 MED ORDER — NITROFURANTOIN MONOHYD MACRO 100 MG PO CAPS: 100.0000 mg | ORAL_CAPSULE | Freq: Two times a day (BID) | ORAL | 0 refills | Status: DC

## 2023-06-28 LAB — URINALYSIS, ROUTINE W REFLEX MICROSCOPIC
Bilirubin, UA: NEGATIVE
Glucose, UA: NEGATIVE
Ketones, UA: NEGATIVE
Nitrite, UA: POSITIVE — AB
Protein,UA: NEGATIVE
RBC, UA: NEGATIVE
Specific Gravity, UA: 1.017 (ref 1.005–1.030)
Urobilinogen, Ur: 1 mg/dL (ref 0.2–1.0)
pH, UA: 7 (ref 5.0–7.5)

## 2023-06-28 LAB — MICROSCOPIC EXAMINATION
Casts: NONE SEEN /lpf
WBC, UA: NONE SEEN /hpf (ref 0–5)

## 2023-07-02 MED ORDER — FLUCONAZOLE 150 MG PO TABS: 150.0000 mg | ORAL_TABLET | Freq: Every day | ORAL | 0 refills | Status: AC

## 2023-07-02 MED ORDER — CIPROFLOXACIN HCL 500 MG PO TABS: 500.0000 mg | ORAL_TABLET | Freq: Two times a day (BID) | ORAL | 0 refills | Status: AC

## 2023-07-02 NOTE — Progress Notes (Signed)
Spoke with patient, DOB verified and patient verbalized understanding.

## 2023-07-03 LAB — URINE CULTURE

## 2024-08-16 ENCOUNTER — Other Ambulatory Visit: Payer: Self-pay | Admitting: Nurse Practitioner

## 2024-08-17 ENCOUNTER — Ambulatory Visit: Admitting: Cardiology

## 2024-08-17 ENCOUNTER — Encounter: Payer: Self-pay | Admitting: Cardiology

## 2024-08-17 VITALS — BP 136/108 | HR 97 | Ht 62.0 in | Wt 168.4 lb

## 2024-08-17 DIAGNOSIS — Z1231 Encounter for screening mammogram for malignant neoplasm of breast: Secondary | ICD-10-CM | POA: Diagnosis not present

## 2024-08-17 DIAGNOSIS — Z131 Encounter for screening for diabetes mellitus: Secondary | ICD-10-CM

## 2024-08-17 DIAGNOSIS — R030 Elevated blood-pressure reading, without diagnosis of hypertension: Secondary | ICD-10-CM

## 2024-08-17 DIAGNOSIS — Z1329 Encounter for screening for other suspected endocrine disorder: Secondary | ICD-10-CM

## 2024-08-17 DIAGNOSIS — Z13220 Encounter for screening for lipoid disorders: Secondary | ICD-10-CM

## 2024-08-17 MED ORDER — SPRINTEC 28 0.25-35 MG-MCG PO TABS: 1.0000 | ORAL_TABLET | Freq: Every day | ORAL | 4 refills | Status: AC

## 2024-08-17 NOTE — Progress Notes (Unsigned)
 Established Patient Office Visit  Subjective:  Patient ID: Toni Wilkins, female    DOB: 20-May-1981  Age: 43 y.o. MRN: [id number]  No chief complaint on file.   Patient in office for over due follow up. Patient feeling well, requesting a refill on her birth control.  Blood pressure elevated. Patient to monitor at home, call with numbers in a few weeks.  Due for lab work, will return when fasting. Due for mammogram, referral sent.  Pap smear at next visit.    No other concerns at this time.   Past Medical History:  Diagnosis Date   Anemia    Anxiety    Depression     History reviewed. No pertinent surgical history.  Social History   Socioeconomic History   Marital status: Single    Spouse name: Not on file   Number of children: Not on file   Years of education: Not on file   Highest education level: Not on file  Occupational History   Not on file  Tobacco Use   Smoking status: Former    Current packs/day: 0.00    Types: Cigarettes    Quit date: 10/01/1998    Years since quitting: 25.8   Smokeless tobacco: Never  Vaping Use   Vaping status: Never Used  Substance and Sexual Activity   Alcohol use: Not on file   Drug use: Not on file   Sexual activity: Not on file  Other Topics Concern   Not on file  Social History Narrative   Not on file   Social Drivers of Health   Financial Resource Strain: Not on file  Food Insecurity: Not on file  Transportation Needs: Not on file  Physical Activity: Not on file  Stress: Not on file  Social Connections: Not on file  Intimate Partner Violence: Not on file    Family History  Problem Relation Age of Onset   Breast cancer Mother 30   Breast cancer Maternal Grandmother     Not on File  Outpatient Medications Prior to Visit  Medication Sig   buPROPion (WELLBUTRIN XL) 300 MG 24 hr tablet    ferrous sulfate 325 (65 FE) MG tablet Take 325 mg by mouth 2 (two) times daily with a meal.   lurasidone (LATUDA) 20 MG  TABS tablet Take by mouth daily.   SUBOXONE 8-2 MG FILM    [DISCONTINUED] SPRINTEC  28 0.25-35 MG-MCG tablet    fluconazole  (DIFLUCAN ) 150 MG tablet Take 1 tablet (150 mg total) by mouth daily. (Patient not taking: Reported on 08/17/2024)   [DISCONTINUED] clonazePAM (KLONOPIN) 0.5 MG tablet    [DISCONTINUED] clonazePAM (KLONOPIN) 1 MG tablet    [DISCONTINUED] nitrofurantoin , macrocrystal-monohydrate, (MACROBID ) 100 MG capsule Take 1 capsule (100 mg total) by mouth 2 (two) times daily. (Patient not taking: Reported on 08/17/2024)   [DISCONTINUED] sertraline (ZOLOFT) 100 MG tablet  (Patient not taking: Reported on 08/17/2024)   No facility-administered medications prior to visit.    Review of Systems  Constitutional: Negative.   HENT: Negative.    Eyes: Negative.   Respiratory: Negative.  Negative for shortness of breath.   Cardiovascular: Negative.  Negative for chest pain.  Gastrointestinal: Negative.  Negative for abdominal pain, constipation and diarrhea.  Genitourinary: Negative.   Musculoskeletal:  Negative for joint pain and myalgias.  Skin: Negative.   Neurological: Negative.  Negative for dizziness and headaches.  Endo/Heme/Allergies: Negative.   All other systems reviewed and are negative.      Objective:  BP (!) 136/108 (BP Location: Right Arm, Patient Position: Sitting, Cuff Size: Large)   Pulse 97   Ht 5' 2 (1.575 m)   Wt 168 lb 6.4 oz (76.4 kg)   LMP 07/27/2024 (Approximate)   SpO2 100%   BMI 30.80 kg/m   Vitals:   08/17/24 1420 08/17/24 1448  BP: (!) 170/118 (!) 136/108  Pulse: 97   Height: 5' 2 (1.575 m)   Weight: 168 lb 6.4 oz (76.4 kg)   SpO2: 100%   BMI (Calculated): 30.79     Physical Exam Vitals and nursing note reviewed.  Constitutional:      Appearance: Normal appearance. She is normal weight.  HENT:     Head: Normocephalic and atraumatic.     Nose: Nose normal.     Mouth/Throat:     Mouth: Mucous membranes are moist.  Eyes:      Extraocular Movements: Extraocular movements intact.     Conjunctiva/sclera: Conjunctivae normal.     Pupils: Pupils are equal, round, and reactive to light.  Cardiovascular:     Rate and Rhythm: Normal rate and regular rhythm.     Pulses: Normal pulses.     Heart sounds: Normal heart sounds.  Pulmonary:     Effort: Pulmonary effort is normal.     Breath sounds: Normal breath sounds.  Abdominal:     General: Abdomen is flat. Bowel sounds are normal.     Palpations: Abdomen is soft.  Musculoskeletal:        General: Normal range of motion.     Cervical back: Normal range of motion.  Skin:    General: Skin is warm and dry.  Neurological:     General: No focal deficit present.     Mental Status: She is alert and oriented to person, place, and time.  Psychiatric:        Mood and Affect: Mood normal.        Behavior: Behavior normal.        Thought Content: Thought content normal.        Judgment: Judgment normal.      No results found for any visits on 08/17/24.  No results found for this or any previous visit (from the past 2160 hours).    Assessment & Plan:  Return for fasting lab work Order for mammogram sent Pap smear at next visit Monitor blood pressure at home  Problem List Items Addressed This Visit       Other   Elevated blood pressure reading in office with white coat syndrome, without diagnosis of hypertension - Primary   Other Visit Diagnoses       Breast cancer screening by mammogram       Relevant Orders   MM 3D SCREENING MAMMOGRAM BILATERAL BREAST     Diabetes mellitus screening       Relevant Orders   CMP14+EGFR   Hemoglobin A1c     Thyroid disorder screening       Relevant Orders   TSH     Lipid screening       Relevant Orders   Lipid Profile       Return in about 6 months (around 02/17/2025) for fasting labs prior, pap smear.   Total time spent: 25 minutes  [employer], NP  08/17/2024   This document may have been prepared by  Dragon Voice Recognition software and as such may include unintentional dictation errors.

## 2024-08-18 DIAGNOSIS — R030 Elevated blood-pressure reading, without diagnosis of hypertension: Secondary | ICD-10-CM | POA: Insufficient documentation

## 2024-09-13 ENCOUNTER — Encounter: Payer: Self-pay | Admitting: Nurse Practitioner

## 2025-02-24 ENCOUNTER — Ambulatory Visit: Admitting: Internal Medicine
